# Patient Record
Sex: Male | Born: 1995 | Race: Black or African American | Hispanic: No | Marital: Single
Health system: Southern US, Community
[De-identification: ages and names within clinical notes are randomized; demographics above are authoritative.]

---

## 2020-04-24 ENCOUNTER — Other Ambulatory Visit: Payer: Self-pay

## 2020-04-24 ENCOUNTER — Encounter (HOSPITAL_COMMUNITY): Payer: Self-pay

## 2020-04-24 ENCOUNTER — Ambulatory Visit (HOSPITAL_COMMUNITY)
Admission: EM | Admit: 2020-04-24 | Discharge: 2020-04-24 | Disposition: A | Discharge status: Home / Self Care | Payer: No Typology Code available for payment source | Attending: Emergency Medicine | Admitting: Emergency Medicine

## 2020-04-24 ENCOUNTER — Ambulatory Visit (INDEPENDENT_AMBULATORY_CARE_PROVIDER_SITE_OTHER): Payer: No Typology Code available for payment source

## 2020-04-24 DIAGNOSIS — X58XXXA Exposure to other specified factors, initial encounter: Secondary | ICD-10-CM | POA: Diagnosis not present

## 2020-04-24 DIAGNOSIS — S93401A Sprain of unspecified ligament of right ankle, initial encounter: Secondary | ICD-10-CM

## 2020-04-24 DIAGNOSIS — M25571 Pain in right ankle and joints of right foot: Secondary | ICD-10-CM

## 2020-04-24 MED ORDER — IBUPROFEN 600 MG PO TABS: 600.0000 mg | ORAL_TABLET | Freq: Four times a day (QID) | ORAL | 0 refills | Status: DC | PRN

## 2020-04-24 MED ORDER — IBUPROFEN 600 MG PO TABS: 600.0000 mg | ORAL_TABLET | Freq: Four times a day (QID) | ORAL | 0 refills | Status: AC | PRN

## 2020-04-24 NOTE — ED Provider Notes (Signed)
[facility]    CSN: [id number] Arrival date & time: 04/24/20  1005      History   Chief Complaint Chief Complaint  Patient presents with  . Ankle Injury    HPI Jordan Gill is a 24 y.o. male.   Patient presents with pain and swelling of his right ankle after injuring it while playing basketball yesterday evening.  He states he stepped on someone else's foot and rolled his ankle.  His pain is worse with weightbearing.  No treatment attempted at home.  He denies numbness, weakness, paresthesias, open wounds, ecchymosis, erythema, or other symptoms.  No pertinent medical history.  The history is provided by the patient.    History reviewed. No pertinent past medical history.  There are no problems to display for this patient.   History reviewed. No pertinent surgical history.     Home Medications    Prior to Admission medications   Medication Sig Start Date End Date Taking? Authorizing Provider  ibuprofen (ADVIL) 600 MG tablet Take 1 tablet (600 mg total) by mouth every 6 (six) hours as needed. 04/24/20   Mickie Bail, NP    Family History History reviewed. No pertinent family history.  Social History Social History   Tobacco Use  . Smoking status: Not on file  Substance Use Topics  . Alcohol use: Not on file  . Drug use: Not on file     Allergies   Patient has no known allergies.   Review of Systems Review of Systems  Constitutional: Negative for chills and fever.  HENT: Negative for ear pain and sore throat.   Eyes: Negative for pain and visual disturbance.  Respiratory: Negative for cough and shortness of breath.   Cardiovascular: Negative for chest pain and palpitations.  Gastrointestinal: Negative for abdominal pain and vomiting.  Genitourinary: Negative for dysuria and hematuria.  Musculoskeletal: Positive for arthralgias, gait problem and joint swelling. Negative for back pain.  Skin: Negative for color change and rash.    Neurological: Negative for seizures, syncope, weakness and numbness.  All other systems reviewed and are negative.    Physical Exam Triage Vital Signs ED Triage Vitals  Enc Vitals Group     BP      Pulse      Resp      Temp      Temp src      SpO2      Weight      Height      Head Circumference      Peak Flow      Pain Score      Pain Loc      Pain Edu?      Excl. in GC?    No data found.  Updated Vital Signs BP 138/79 (BP Location: Left Arm)   Pulse 75   Temp 98 F (36.7 C) (Oral)   Resp 16   SpO2 98%   Visual Acuity Right Eye Distance:   Left Eye Distance:   Bilateral Distance:    Right Eye Near:   Left Eye Near:    Bilateral Near:     Physical Exam Vitals and nursing note reviewed.  Constitutional:      General: He is not in acute distress.    Appearance: He is well-developed. He is not ill-appearing.  HENT:     Head: Normocephalic and atraumatic.     Mouth/Throat:     Mouth: Mucous membranes are moist.  Eyes:  Conjunctiva/sclera: Conjunctivae normal.  Cardiovascular:     Rate and Rhythm: Normal rate and regular rhythm.     Heart sounds: No murmur heard.   Pulmonary:     Effort: Pulmonary effort is normal. No respiratory distress.     Breath sounds: Normal breath sounds.  Abdominal:     Palpations: Abdomen is soft.     Tenderness: There is no abdominal tenderness.  Musculoskeletal:        General: Swelling and tenderness present. No deformity or signs of injury.     Cervical back: Neck supple.       Feet:     Comments: Right LE: limited ankle ROM due to swelling and discomfort.  2+ pulses, sensation intact, strength 5/5. No open wounds, erythema, ecchymosis.   Skin:    General: Skin is warm and dry.     Findings: No bruising, erythema, lesion or rash.  Neurological:     General: No focal deficit present.     Mental Status: He is alert and oriented to person, place, and time.     Sensory: No sensory deficit.     Motor: No weakness.      Gait: Gait abnormal.  Psychiatric:        Mood and Affect: Mood normal.        Behavior: Behavior normal.      UC Treatments / Results  Labs (all labs ordered are listed, but only abnormal results are displayed) Labs Reviewed - No data to display  EKG   Radiology DG Ankle Complete Right  Result Date: 04/24/2020 CLINICAL DATA:  Right ankle pain and swelling after injury last night. EXAM: RIGHT ANKLE - COMPLETE 3+ VIEW COMPARISON:  None. FINDINGS: There is no evidence of fracture, dislocation, or joint effusion. There is no evidence of arthropathy or other focal bone abnormality. Soft tissue swelling is noted over lateral malleolus. IMPRESSION: No fracture or dislocation is noted. Soft tissue swelling is noted over lateral malleolus. Electronically Signed   By: Lupita Raider M.D.   On: 04/24/2020 11:16    Procedures Procedures (including critical care time)  Medications Ordered in UC Medications - No data to display  Initial Impression / Assessment and Plan / UC Course  I have reviewed the triage vital signs and the nursing notes.  Pertinent labs & imaging results that were available during my care of the patient were reviewed by me and considered in my medical decision making (see chart for details).   Right ankle sprain.  X-ray shows no fracture or dislocation.  Treating with ankle splint, crutches, ibuprofen, rest, elevation, ice packs.  Instructed patient to follow-up with orthopedics.  Patient agrees to plan of care.   Final Clinical Impressions(s) / UC Diagnoses   Final diagnoses:  Sprain of right ankle, unspecified ligament, initial encounter     Discharge Instructions     Take the ibuprofen as prescribed.  Rest and elevate your ankle.  Apply ice packs 2-3 times a day for up to 20 minutes each.  Wear the ankle splint and use the crutches.    Follow up with an orthopedist such as the one listed below.         ED Prescriptions    Medication Sig  Dispense Auth. Provider   ibuprofen (ADVIL) 600 MG tablet  (Status: Discontinued) Take 1 tablet (600 mg total) by mouth every 6 (six) hours as needed. 30 tablet Mickie Bail, NP   ibuprofen (ADVIL) 600 MG tablet Take 1 tablet (  600 mg total) by mouth every 6 (six) hours as needed. 30 tablet Mickie Bail, NP     PDMP not reviewed this encounter.   Mickie Bail, NP 04/24/20 [phone]

## 2020-04-24 NOTE — ED Triage Notes (Addendum)
Pt complaining of right ankle injury while playing basketball last night. He states that he rolled his ankle inward and stepped foot on some foot. Now he is unable bare weight on his  Ankle.

## 2020-04-24 NOTE — Discharge Instructions (Addendum)
Take the ibuprofen as prescribed.  Rest and elevate your ankle.  Apply ice packs 2-3 times a day for up to 20 minutes each.  Wear the ankle splint and use the crutches.    Follow up with an orthopedist such as the one listed below.

## 2021-10-04 IMAGING — DX DG ANKLE COMPLETE 3+V*R*
3 series · 3 of 3 positions shown · non-contrast
Comparison: None.

CLINICAL DATA: Right ankle pain and swelling after injury last
night.

EXAM:
RIGHT ANKLE - COMPLETE 3+ VIEW

[ankle ap]
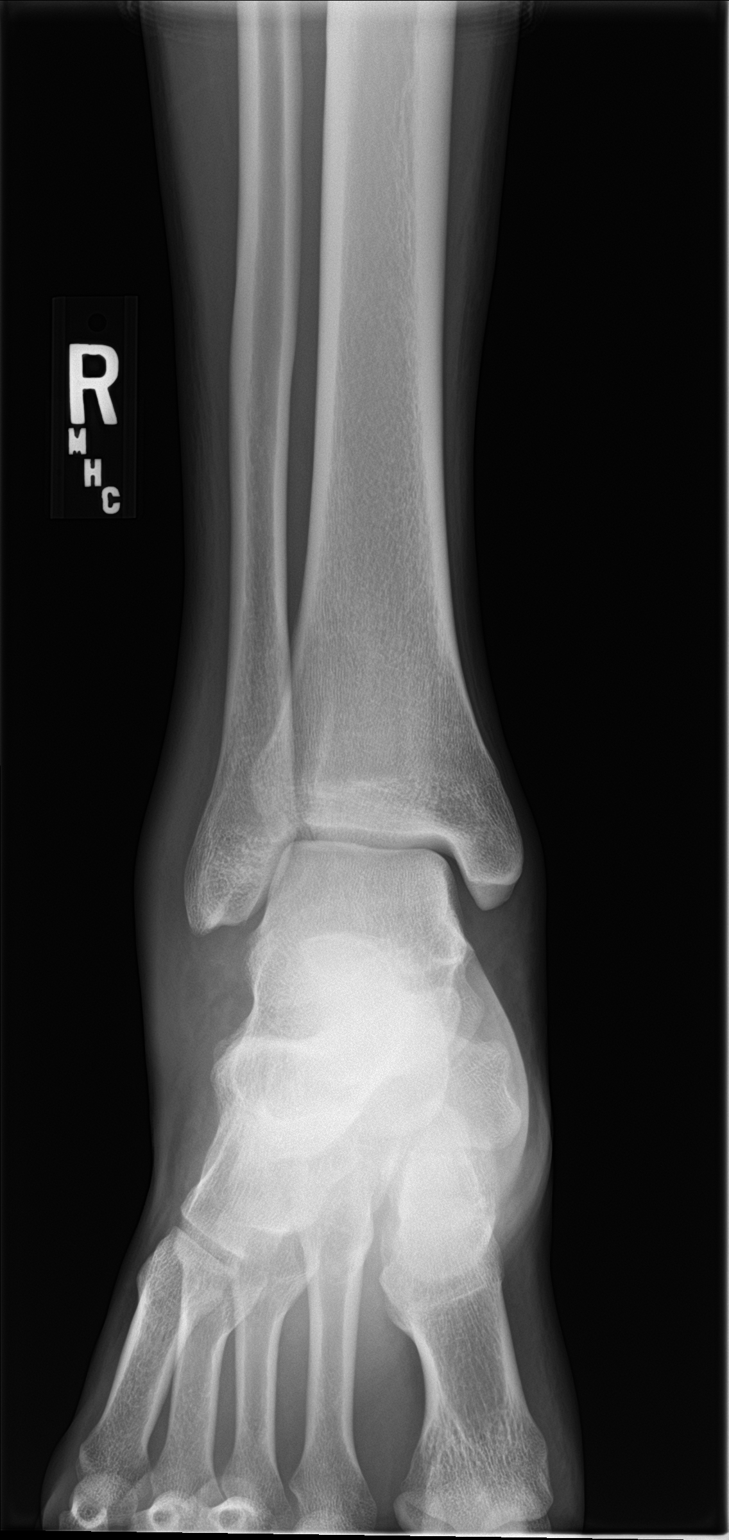

[ankle obl]
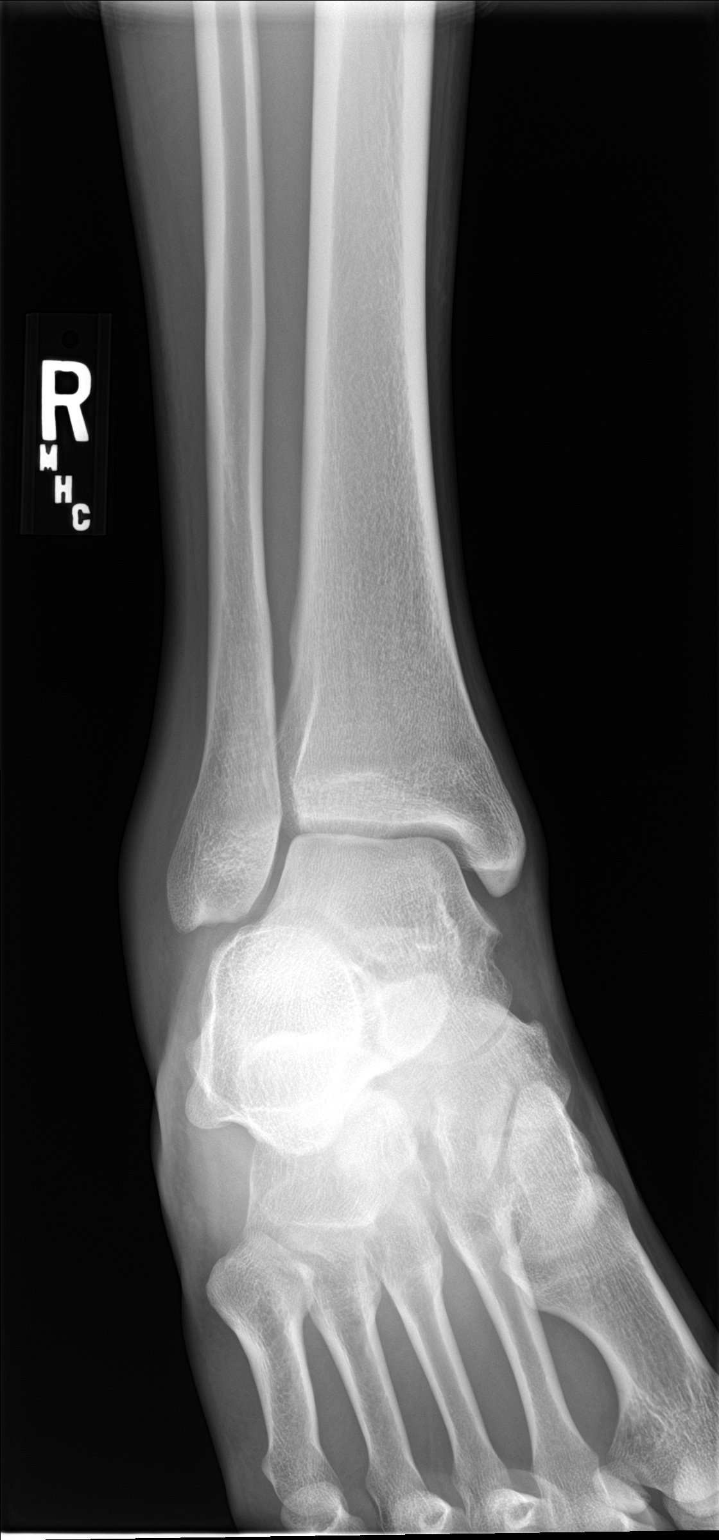

[ankle lat]
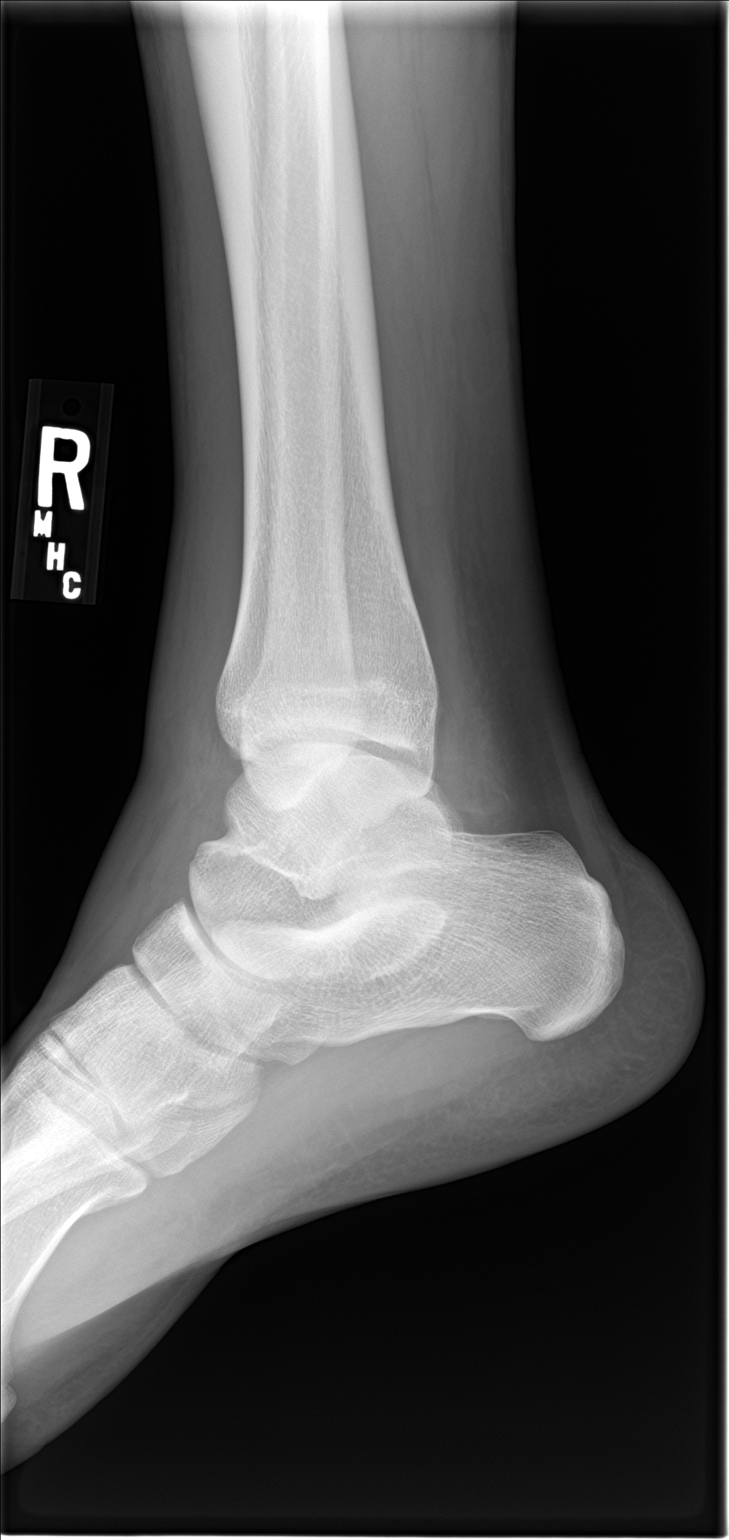

[3 of 3 positions shown; findings below may reference images not displayed]

FINDINGS: There is no evidence of fracture, dislocation, or joint effusion.
There is no evidence of arthropathy or other focal bone abnormality.
Soft tissue swelling is noted over lateral malleolus.
IMPRESSION: No fracture or dislocation is noted. Soft tissue swelling is noted
over lateral malleolus.

## 2022-09-17 ENCOUNTER — Ambulatory Visit: Payer: Self-pay
